# Patient Record
Sex: Female | Born: 1950 | Race: White | Hispanic: No | Marital: Married | Smoking: Never smoker
Health system: Southern US, Community
[De-identification: ages and names within clinical notes are randomized; demographics above are authoritative.]

## PROBLEM LIST (undated history)

## (undated) DIAGNOSIS — C801 Malignant (primary) neoplasm, unspecified: Secondary | ICD-10-CM

## (undated) DIAGNOSIS — E7800 Pure hypercholesterolemia, unspecified: Secondary | ICD-10-CM

## (undated) HISTORY — PX: AUGMENTATION MAMMAPLASTY: SUR837

---

## 2007-04-05 ENCOUNTER — Other Ambulatory Visit: Admission: RE | Admit: 2007-04-05 | Discharge: 2007-04-05 | Payer: Self-pay | Admitting: Family Medicine

## 2007-05-31 ENCOUNTER — Other Ambulatory Visit: Admission: RE | Admit: 2007-05-31 | Discharge: 2007-05-31 | Payer: Self-pay | Admitting: Family Medicine

## 2008-08-06 ENCOUNTER — Other Ambulatory Visit: Admission: RE | Admit: 2008-08-06 | Discharge: 2008-08-06 | Payer: Self-pay | Admitting: Family Medicine

## 2009-10-29 ENCOUNTER — Other Ambulatory Visit: Admission: RE | Admit: 2009-10-29 | Discharge: 2009-10-29 | Payer: Self-pay | Admitting: Family Medicine

## 2010-02-07 ENCOUNTER — Other Ambulatory Visit: Admission: RE | Admit: 2010-02-07 | Discharge: 2010-02-07 | Payer: Self-pay | Admitting: Family Medicine

## 2011-01-27 ENCOUNTER — Other Ambulatory Visit (HOSPITAL_COMMUNITY)
Admission: RE | Admit: 2011-01-27 | Discharge: 2011-01-27 | Disposition: A | Discharge status: Home / Self Care | Payer: BC Managed Care – PPO | Source: Ambulatory Visit | Attending: Family Medicine | Admitting: Family Medicine

## 2011-01-27 ENCOUNTER — Other Ambulatory Visit: Payer: Self-pay | Admitting: Family Medicine

## 2011-01-27 DIAGNOSIS — Z124 Encounter for screening for malignant neoplasm of cervix: Secondary | ICD-10-CM | POA: Insufficient documentation

## 2012-08-09 ENCOUNTER — Other Ambulatory Visit: Payer: Self-pay | Admitting: Family Medicine

## 2012-08-09 DIAGNOSIS — N950 Postmenopausal bleeding: Secondary | ICD-10-CM

## 2012-08-16 ENCOUNTER — Ambulatory Visit
Admission: RE | Admit: 2012-08-16 | Discharge: 2012-08-16 | Disposition: A | Discharge status: Home / Self Care | Payer: BC Managed Care – PPO | Source: Ambulatory Visit | Attending: Family Medicine | Admitting: Family Medicine

## 2012-08-16 ENCOUNTER — Ambulatory Visit: Payer: Self-pay | Admitting: Specialist

## 2012-08-16 DIAGNOSIS — N950 Postmenopausal bleeding: Secondary | ICD-10-CM

## 2012-09-02 ENCOUNTER — Ambulatory Visit: Payer: Self-pay | Admitting: Specialist

## 2012-09-02 LAB — POTASSIUM: Potassium: 4.7 mmol/L (ref 3.5–5.1)

## 2012-09-04 ENCOUNTER — Ambulatory Visit: Payer: Self-pay | Admitting: Specialist

## 2012-10-25 ENCOUNTER — Other Ambulatory Visit (HOSPITAL_COMMUNITY)
Admission: RE | Admit: 2012-10-25 | Discharge: 2012-10-25 | Disposition: A | Discharge status: Home / Self Care | Payer: BC Managed Care – PPO | Source: Ambulatory Visit | Attending: Family Medicine | Admitting: Family Medicine

## 2012-10-25 ENCOUNTER — Other Ambulatory Visit: Payer: Self-pay | Admitting: Family Medicine

## 2012-10-25 DIAGNOSIS — Z124 Encounter for screening for malignant neoplasm of cervix: Secondary | ICD-10-CM | POA: Insufficient documentation

## 2013-01-31 ENCOUNTER — Other Ambulatory Visit (HOSPITAL_COMMUNITY)
Admission: RE | Admit: 2013-01-31 | Discharge: 2013-01-31 | Disposition: A | Discharge status: Home / Self Care | Payer: BC Managed Care – PPO | Source: Ambulatory Visit | Attending: Family Medicine | Admitting: Family Medicine

## 2013-01-31 ENCOUNTER — Other Ambulatory Visit: Payer: Self-pay | Admitting: Family Medicine

## 2013-01-31 DIAGNOSIS — R6889 Other general symptoms and signs: Secondary | ICD-10-CM | POA: Insufficient documentation

## 2013-01-31 DIAGNOSIS — Z1151 Encounter for screening for human papillomavirus (HPV): Secondary | ICD-10-CM | POA: Insufficient documentation

## 2014-09-11 ENCOUNTER — Other Ambulatory Visit: Payer: Self-pay | Admitting: Gastroenterology

## 2015-01-12 NOTE — Op Note (Signed)
PATIENT NAME:  Norma Rodgers, Norma Rodgers MR#:  [id number] DATE OF BIRTH:  1951/02/24  DATE OF PROCEDURE:  09/04/2012  PREOPERATIVE DIAGNOSIS: Tear posterior horn medial meniscus, left knee.  POSTOPERATIVE DIAGNOSIS: Tear posterior horn medial meniscus, left knee.   PROCEDURE: Arthroscopic partial medial meniscectomy, left knee.   SURGEON: Lucas Mallow, MD    ANESTHESIA: General.   COMPLICATIONS: None.   PROCEDURE: After adequate induction of general anesthesia, the left lower leg is placed in the arthroscopy knee holder and secured in the usual fashion. The left knee and leg are thoroughly prepped with alcohol and ChloraPrep and draped in standard sterile fashion. The joint is infiltrated with 10 mL of Marcaine with epinephrine. Diagnostic arthroscopy is performed. There is minimal increased synovitis present in the suprapatellar pouch. The anterior femoral surface is normal. There is very minimal mild chondromalacia on the posterior aspect of the patella. In the medial compartment there is an area of significant chondromalacia of the articular surface of the medial femoral condyle which is at the posteromedial aspect of the knee which directly correlates with a tear in the medial meniscus which is longitudinal. The probe is used to fully define the tear. Using the basket forceps and the full radial resector, the tear is resected back to a stable rim. The ArthroWand is also used. The remainder of the posterior horn is fully probed and no other problems are noted. There is chondromalacia, which is mild, of the tibial articular surface. In the intercondylar notch there is mild increased synovial hypertrophy. The anterior cruciate ligament is normal. The lateral compartment is completely normal. The joint is thoroughly irrigated multiple times. All wounds were closed with 5-0 nylon. The joint is infiltrated with 10 mL of Marcaine with epinephrine with 4 mg of morphine and 1 mL of Depo-Medrol. She tolerated  the procedure quite well. Soft bulky dressing is applied. She is to return to the recovery room in satisfactory condition having tolerated the procedure quite well.  ____________________________ Lucas Mallow, MD ces:drc D: 09/05/2012 11:38:15 ET T: 09/05/2012 11:43:17 ET JOB#: [id number] Lucas Mallow MD ELECTRONICALLY SIGNED 09/12/2012 7:25

## 2015-03-09 ENCOUNTER — Other Ambulatory Visit: Payer: Self-pay | Admitting: Family Medicine

## 2015-03-09 DIAGNOSIS — Z1231 Encounter for screening mammogram for malignant neoplasm of breast: Secondary | ICD-10-CM

## 2015-04-06 ENCOUNTER — Ambulatory Visit
Admission: RE | Admit: 2015-04-06 | Discharge: 2015-04-06 | Disposition: A | Discharge status: Home / Self Care | Payer: No Typology Code available for payment source | Source: Ambulatory Visit | Attending: Family Medicine | Admitting: Family Medicine

## 2015-04-06 DIAGNOSIS — Z1231 Encounter for screening mammogram for malignant neoplasm of breast: Secondary | ICD-10-CM | POA: Insufficient documentation

## 2015-09-06 ENCOUNTER — Encounter: Payer: Self-pay | Admitting: Emergency Medicine

## 2015-09-06 ENCOUNTER — Ambulatory Visit
Admission: EM | Admit: 2015-09-06 | Discharge: 2015-09-06 | Disposition: A | Discharge status: Home / Self Care | Payer: No Typology Code available for payment source | Attending: Family Medicine | Admitting: Family Medicine

## 2015-09-06 DIAGNOSIS — J0100 Acute maxillary sinusitis, unspecified: Secondary | ICD-10-CM

## 2015-09-06 DIAGNOSIS — J0110 Acute frontal sinusitis, unspecified: Secondary | ICD-10-CM

## 2015-09-06 HISTORY — DX: Pure hypercholesterolemia, unspecified: E78.00

## 2015-09-06 MED ORDER — BENZONATATE 100 MG PO CAPS: 100.0000 mg | ORAL_CAPSULE | Freq: Three times a day (TID) | ORAL | Status: AC | PRN

## 2015-09-06 MED ORDER — FLUCONAZOLE 150 MG PO TABS: 150.0000 mg | ORAL_TABLET | Freq: Every day | ORAL | Status: AC

## 2015-09-06 MED ORDER — AMOXICILLIN-POT CLAVULANATE 875-125 MG PO TABS: 1.0000 | ORAL_TABLET | Freq: Two times a day (BID) | ORAL | Status: AC

## 2015-09-06 NOTE — ED Provider Notes (Signed)
[facility]  ____________________________________________  Time seen: Approximately 12:46 PM  I have reviewed the triage vital signs and the nursing notes.   HISTORY  Chief Complaint Facial Pain and Nasal Congestion  HPI Norma Rodgers is a 64 y.o. female  presents for one week of runny nose, nasal congestion, sinus pressure for one week. States started with clear rhinorrhea but has quickly progressed and states frequent green thick nasal drainage. Patient states intermittent cough which is more at night as well as first thing in the morning with feeling the drainage in the back of her throat.  Reports continues to eat and drink well. Denies chest pain, shortness of breath, abdominal pain, dizziness, weakness.  States current sinus pain is in the front of her face and cheeks described as a pressure at 6 out of 10. Denies pain radiation. Denies aggravating factors. States unrelieved with over-the-counter medications.  PCP: Leonides Schanz   Past Medical History  Diagnosis Date  . Hypercholesteremia     There are no active problems to display for this patient.   Past Surgical History  Procedure Laterality Date  . Augmentation mammaplasty Bilateral 20+ yrs ago    Current Outpatient Rx  Name  Route  Sig  Dispense  Refill  . calcium-vitamin D (OSCAL WITH D) 500-200 MG-UNIT tablet   Oral   Take 1 tablet by mouth daily.         Marland Kitchen omega-3 acid ethyl esters (LOVAZA) 1 G capsule   Oral   Take 1 g by mouth daily.         . pravastatin (PRAVACHOL) 20 MG tablet   Oral   Take 20 mg by mouth daily.           Allergies Review of patient's allergies indicates no known allergies.  History reviewed. No pertinent family history.  Social History Social History  Substance Use Topics  . Smoking status: Never Smoker   . Smokeless tobacco: None  . Alcohol Use: Yes   LMP: post menopausal  Review of Systems Constitutional: Felt she may have had a low grade fever  yesterday. Did not measure. Felt warm. None since.  Eyes: No visual changes. ENT: No sore throat. Positive for runny nose, nasal congestion and sinus pressure.  Cardiovascular: Denies chest pain. Respiratory: Denies shortness of breath. Gastrointestinal: No abdominal pain.  No nausea, no vomiting.  No diarrhea.  No constipation. Genitourinary: Negative for dysuria. Musculoskeletal: Negative for back pain. Skin: Negative for rash. Neurological: Negative for headaches, focal weakness or numbness.  10-point ROS otherwise negative.  ____________________________________________   PHYSICAL EXAM:  VITAL SIGNS: ED Triage Vitals  Enc Vitals Group     BP 09/06/15 1232 167/81 mmHg     Pulse Rate 09/06/15 1232 97     Resp 09/06/15 1232 16     Temp 09/06/15 1232 96.8 F (36 C)     Temp Source 09/06/15 1232 Tympanic     SpO2 09/06/15 1232 100 %     Weight 09/06/15 1232 175 lb (79.379 kg)     Height 09/06/15 1232 5\' 6"  (1.676 m)     Head Cir --      Peak Flow --      Pain Score 09/06/15 1236 3     Pain Loc --      Pain Edu? --      Excl. in [place]? --     Constitutional: Alert and oriented. Well appearing and in no acute distress. Eyes: Conjunctivae are normal. PERRL.  EOMI. Head: Atraumatic. Moderate tenderness to palpation bilateral frontal and maxillary sinuses. No swelling. No erythema.  Ears: no erythema, normal TMs bilaterally.   Nose: nasal congestion, nasal turbinate erythema.   Mouth/Throat: Mucous membranes are moist.  Oropharynx non-erythematous. No tonsillar swelling or exudate. Neck: No stridor.  No cervical spine tenderness to palpation. Hematological/Lymphatic/Immunilogical: No cervical lymphadenopathy. Cardiovascular: Normal rate, regular rhythm. Grossly normal heart sounds.  Good peripheral circulation. Respiratory: Normal respiratory effort.  No retractions. Lungs CTAB. No wheezes, rales or rhonchi. Good air movement. Gastrointestinal: Soft and nontender. No distention.  Normal Bowel sounds.  Musculoskeletal: No lower or upper extremity tenderness nor edema. Bilateral pedal pulses equal and easily palpated.  Neurologic:  Normal speech and language. No gross focal neurologic deficits are appreciated. No gait instability. Skin:  Skin is warm, dry and intact. No rash noted. Psychiatric: Mood and affect are normal. Speech and behavior are normal.  ____________________________________________   LABS (all labs ordered are listed, but only abnormal results are displayed)  Labs Reviewed - No data to display ____________________________________________   INITIAL IMPRESSION / ASSESSMENT AND PLAN / ED COURSE  Pertinent labs & imaging results that were available during my care of the patient were reviewed by me and considered in my medical decision making (see chart for details).   Very well-appearing patient. No acute distress. Presents for the complaints of over a week of runny nose, nasal congestion, sinus pressure and purulent green nasal drainage. Lungs clear throughout. Moist mucous membranes. Abdomen soft and nontender. Will treat frontal and maxillary sinusitis with oral Augmentin, when necessary Tessalon Perles, supportive treatments including rest, fluids and PCP follow-up. Patient requests prescription for Diflucan as she reports vaginal yeast infections with antibiotics, Rx given.    Counseled patient regarding monitoring blood pressure at home and keeping journal. Patient reports that she just recently saw her primary care physician and  blood pressure was normal "120s/70s". Patient states that her blood pressure tends to elevate when she takes over-the-counter medications as well as when she does not feel well, encourage patients to monitor over-the-counter medication use and to decrease. Denies chest pain, shortness of breath, vision changes, dizziness or weakness.  Discussed follow up with Primary care physician this week. Discussed follow up and return  parameters including no resolution or any worsening concerns. Patient verbalized understanding and agreed to plan.   ____________________________________________   FINAL CLINICAL IMPRESSION(S) / ED DIAGNOSES  Final diagnoses:  Acute frontal sinusitis, recurrence not specified  Acute maxillary sinusitis, recurrence not specified       Marylene Land, NP 09/06/15 1431

## 2015-09-06 NOTE — ED Notes (Signed)
Patient c/o sinus pressure and pain, HAs, and nasal congestion for a week.

## 2015-09-06 NOTE — Discharge Instructions (Signed)
Rest. Drink and eat regularly. Take medication as prescribed.   Follow up with your primary care physician this week as needed. Return to Urgent care as needed for new or worsening concerns.   Sinusitis, Adult Sinusitis is redness, soreness, and inflammation of the paranasal sinuses. Paranasal sinuses are air pockets within the bones of your face. They are located beneath your eyes, in the middle of your forehead, and above your eyes. In healthy paranasal sinuses, mucus is able to drain out, and air is able to circulate through them by way of your nose. However, when your paranasal sinuses are inflamed, mucus and air can become trapped. This can allow bacteria and other germs to grow and cause infection. Sinusitis can develop quickly and last only a short time (acute) or continue over a long period (chronic). Sinusitis that lasts for more than 12 weeks is considered chronic. CAUSES Causes of sinusitis include:  Allergies.  Structural abnormalities, such as displacement of the cartilage that separates your nostrils (deviated septum), which can decrease the air flow through your nose and sinuses and affect sinus drainage.  Functional abnormalities, such as when the small hairs (cilia) that line your sinuses and help remove mucus do not work properly or are not present. SIGNS AND SYMPTOMS Symptoms of acute and chronic sinusitis are the same. The primary symptoms are pain and pressure around the affected sinuses. Other symptoms include:  Upper toothache.  Earache.  Headache.  Bad breath.  Decreased sense of smell and taste.  A cough, which worsens when you are lying flat.  Fatigue.  Fever.  Thick drainage from your nose, which often is green and may contain pus (purulent).  Swelling and warmth over the affected sinuses. DIAGNOSIS Your health care provider will perform a physical exam. During your exam, your health care provider may perform any of the following to help determine if  you have acute sinusitis or chronic sinusitis:  Look in your nose for signs of abnormal growths in your nostrils (nasal polyps).  Tap over the affected sinus to check for signs of infection.  View the inside of your sinuses using an imaging device that has a light attached (endoscope). If your health care provider suspects that you have chronic sinusitis, one or more of the following tests may be recommended:  Allergy tests.  Nasal culture. A sample of mucus is taken from your nose, sent to a lab, and screened for bacteria.  Nasal cytology. A sample of mucus is taken from your nose and examined by your health care provider to determine if your sinusitis is related to an allergy. TREATMENT Most cases of acute sinusitis are related to a viral infection and will resolve on their own within 10 days. Sometimes, medicines are prescribed to help relieve symptoms of both acute and chronic sinusitis. These may include pain medicines, decongestants, nasal steroid sprays, or saline sprays. However, for sinusitis related to a bacterial infection, your health care provider will prescribe antibiotic medicines. These are medicines that will help kill the bacteria causing the infection. Rarely, sinusitis is caused by a fungal infection. In these cases, your health care provider will prescribe antifungal medicine. For some cases of chronic sinusitis, surgery is needed. Generally, these are cases in which sinusitis recurs more than 3 times per year, despite other treatments. HOME CARE INSTRUCTIONS  Drink plenty of water. Water helps thin the mucus so your sinuses can drain more easily.  Use a humidifier.  Inhale steam 3-4 times a day (for example, sit  in the bathroom with the shower running). °· Apply a warm, moist washcloth to your face 3-4 times a day, or as directed by your health care provider. °· Use saline nasal sprays to help moisten and clean your sinuses. °· Take medicines only as directed by your  health care provider. °· If you were prescribed either an antibiotic or antifungal medicine, finish it all even if you start to feel better. °SEEK IMMEDIATE MEDICAL CARE IF: °· You have increasing pain or severe headaches. °· You have nausea, vomiting, or drowsiness. °· You have swelling around your face. °· You have vision problems. °· You have a stiff neck. °· You have difficulty breathing. °  °This information is not intended to replace advice given to you by your health care provider. Make sure you discuss any questions you have with your health care provider. °  °Document Released: 09/11/2005 Document Revised: 10/02/2014 Document Reviewed: 09/26/2011 °Elsevier Interactive Patient Education ©2016 [employer] ° °

## 2015-09-27 ENCOUNTER — Ambulatory Visit
Admission: EM | Admit: 2015-09-27 | Discharge: 2015-09-27 | Disposition: A | Discharge status: Home / Self Care | Payer: PRIVATE HEALTH INSURANCE | Attending: Family Medicine | Admitting: Family Medicine

## 2015-09-27 ENCOUNTER — Encounter: Payer: Self-pay | Admitting: Emergency Medicine

## 2015-09-27 DIAGNOSIS — J320 Chronic maxillary sinusitis: Secondary | ICD-10-CM | POA: Diagnosis not present

## 2015-09-27 MED ORDER — HYDROCOD POLST-CPM POLST ER 10-8 MG/5ML PO SUER: 5.0000 mL | Freq: Two times a day (BID) | ORAL | Status: AC

## 2015-09-27 MED ORDER — FLUTICASONE PROPIONATE 50 MCG/ACT NA SUSP: 2.0000 | Freq: Every day | NASAL | Status: AC

## 2015-09-27 MED ORDER — AZITHROMYCIN 250 MG PO TABS
ORAL_TABLET | ORAL | Status: AC
Start: 2015-09-27 — End: ?

## 2015-09-27 NOTE — Discharge Instructions (Signed)

## 2015-09-27 NOTE — ED Provider Notes (Signed)
CSN: [id number]     Arrival date & time 09/27/15  1539 History   First MD Initiated Contact with Patient 09/27/15 1843     Chief Complaint  Patient presents with  . Facial Pain  . Nasal Congestion   (Consider location/radiation/quality/duration/timing/severity/associated sxs/prior Treatment) HPI   65 year old female who returns today after being seen here on 09/06/2015 for sinus infection. She states that she was treated with a Augmentin and Tessalon Perles which all helped and she got better and has since returned with the same symptoms. She states that she has had fever although she has never measured it but has been sweating and feels that that represents the fever. She putting of facial tenderness over the maxillary sinus and states that in the past she's had chronic sinusitis on the left that was discovered during a CAT scan performed for a neurological problem that she was having at the time. Over-the-counter medications have not been providing any relief for her. Temperature today is 97 pulse of 87 respirations 16 blood pressure 162/68 and an O2 sat on room air. 100%  Past Medical History  Diagnosis Date  . Hypercholesteremia    Past Surgical History  Procedure Laterality Date  . Augmentation mammaplasty Bilateral 20+ yrs ago   History reviewed. No pertinent family history. Social History  Substance Use Topics  . Smoking status: Never Smoker   . Smokeless tobacco: None  . Alcohol Use: Yes   OB History    No data available     Review of Systems  Constitutional: Positive for chills. Negative for fever and fatigue.  HENT: Positive for congestion, postnasal drip, rhinorrhea, sinus pressure, sneezing and voice change.   Respiratory: Positive for cough. Negative for shortness of breath, wheezing and stridor.   All other systems reviewed and are negative.   Allergies  Review of patient's allergies indicates no known allergies.  Home Medications   Prior to Admission  medications   Medication Sig Start Date End Date Taking? Authorizing Provider  amoxicillin-clavulanate (AUGMENTIN) 875-125 MG tablet Take 1 tablet by mouth every 12 (twelve) hours. 09/06/15   Marylene Land, NP  azithromycin (ZITHROMAX Z-PAK) 250 MG tablet Use as per package instructions 09/27/15   Lorin Picket, PA-C  benzonatate (TESSALON PERLES) 100 MG capsule Take 1 capsule (100 mg total) by mouth 3 (three) times daily as needed for cough. 09/06/15   Marylene Land, NP  calcium-vitamin D (OSCAL WITH D) 500-200 MG-UNIT tablet Take 1 tablet by mouth daily.    Historical Provider, MD  chlorpheniramine-HYDROcodone (TUSSIONEX PENNKINETIC ER) 10-8 MG/5ML SUER Take 5 mLs by mouth 2 (two) times daily. 09/27/15   Lorin Picket, PA-C  fluconazole (DIFLUCAN) 150 MG tablet Take 1 tablet (150 mg total) by mouth daily. 09/06/15   Marylene Land, NP  fluticasone (FLONASE) 50 MCG/ACT nasal spray Place 2 sprays into both nostrils daily. 09/27/15   Lorin Picket, PA-C  omega-3 acid ethyl esters (LOVAZA) 1 G capsule Take 1 g by mouth daily.    Historical Provider, MD  pravastatin (PRAVACHOL) 20 MG tablet Take 20 mg by mouth daily.    Historical Provider, MD   Meds Ordered and Administered this Visit  Medications - No data to display  BP 162/68 mmHg  Pulse 87  Temp(Src) 97 F (36.1 C) (Tympanic)  Resp 16  Ht 5\' 6"  (1.676 m)  Wt 170 lb (77.111 kg)  BMI 27.45 kg/m2  SpO2 100% No data found.   Physical Exam  Constitutional: She is  oriented to person, place, and time. She appears well-developed and well-nourished. No distress.  HENT:  Head: Normocephalic and atraumatic.  Right Ear: External ear normal.  Left Ear: External ear normal.  Nose: Nose normal.  Mouth/Throat: Oropharynx is clear and moist. No oropharyngeal exudate.  Eyes: Conjunctivae are normal. Pupils are equal, round, and reactive to light.  Neck: Normal range of motion. Neck supple.  Pulmonary/Chest: Breath sounds normal. No  respiratory distress. She has no wheezes. She has no rales.  Musculoskeletal: Normal range of motion. She exhibits no edema or tenderness.  Lymphadenopathy:    She has no cervical adenopathy.  Neurological: She is alert and oriented to person, place, and time.  Skin: Skin is warm and dry. She is not diaphoretic.  Psychiatric: She has a normal mood and affect. Her behavior is normal. Judgment and thought content normal.  Nursing note and vitals reviewed.   ED Course  Procedures (including critical care time)  Labs Review Labs Reviewed - No data to display  Imaging Review No results found.   Visual Acuity Review  Right Eye Distance:   Left Eye Distance:   Bilateral Distance:    Right Eye Near:   Left Eye Near:    Bilateral Near:         MDM   1. Chronic left maxillary sinusitis    Discharge Medication List as of 09/27/2015  7:05 PM    START taking these medications   Details  azithromycin (ZITHROMAX Z-PAK) 250 MG tablet Use as per package instructions, Print    chlorpheniramine-HYDROcodone (TUSSIONEX PENNKINETIC ER) 10-8 MG/5ML SUER Take 5 mLs by mouth 2 (two) times daily., Starting 09/27/2015, Until Discontinued, Print    fluticasone (FLONASE) 50 MCG/ACT nasal spray Place 2 sprays into both nostrils daily., Starting 09/27/2015, Until Discontinued, Print      Plan: 1. Test/x-ray results and diagnosis reviewed with patient 2. rx as per orders; risks, benefits, potential side effects reviewed with patient 3. Recommend supportive treatment with fluids and rest. Follow-up with her primary care physician if she continues to have problems 4. F/u prn if symptoms worsen or don't improve     Lorin Picket, PA-C 09/27/15 2051

## 2015-09-27 NOTE — ED Notes (Addendum)
Patient c/o sinus infection and took Augmentin on Dec. 12.  Patient states that she has not completely gotten better.

## 2016-04-17 ENCOUNTER — Other Ambulatory Visit: Payer: Self-pay | Admitting: Family Medicine

## 2016-04-17 DIAGNOSIS — Z1231 Encounter for screening mammogram for malignant neoplasm of breast: Secondary | ICD-10-CM

## 2016-05-17 ENCOUNTER — Other Ambulatory Visit: Payer: Self-pay | Admitting: Family Medicine

## 2016-05-17 ENCOUNTER — Ambulatory Visit
Admission: RE | Admit: 2016-05-17 | Discharge: 2016-05-17 | Disposition: A | Discharge status: Home / Self Care | Payer: PRIVATE HEALTH INSURANCE | Source: Ambulatory Visit | Attending: Family Medicine | Admitting: Family Medicine

## 2016-05-17 DIAGNOSIS — Z1231 Encounter for screening mammogram for malignant neoplasm of breast: Secondary | ICD-10-CM | POA: Insufficient documentation

## 2017-02-26 ENCOUNTER — Other Ambulatory Visit: Payer: Self-pay | Admitting: Family Medicine

## 2017-02-26 ENCOUNTER — Other Ambulatory Visit (HOSPITAL_COMMUNITY)
Admission: RE | Admit: 2017-02-26 | Discharge: 2017-02-26 | Disposition: A | Discharge status: Home / Self Care | Payer: PRIVATE HEALTH INSURANCE | Source: Ambulatory Visit | Attending: Family Medicine | Admitting: Family Medicine

## 2017-02-26 DIAGNOSIS — Z124 Encounter for screening for malignant neoplasm of cervix: Secondary | ICD-10-CM | POA: Insufficient documentation

## 2017-03-02 LAB — CYTOLOGY - PAP
DIAGNOSIS: UNDETERMINED — AB
HPV (WINDOPATH): NOT DETECTED

## 2017-04-06 ENCOUNTER — Other Ambulatory Visit: Payer: Self-pay | Admitting: Family Medicine

## 2017-04-06 DIAGNOSIS — Z1231 Encounter for screening mammogram for malignant neoplasm of breast: Secondary | ICD-10-CM

## 2017-05-31 ENCOUNTER — Ambulatory Visit
Admission: RE | Admit: 2017-05-31 | Discharge: 2017-05-31 | Disposition: A | Discharge status: Home / Self Care | Payer: PRIVATE HEALTH INSURANCE | Source: Ambulatory Visit | Attending: Family Medicine | Admitting: Family Medicine

## 2017-05-31 DIAGNOSIS — Z1231 Encounter for screening mammogram for malignant neoplasm of breast: Secondary | ICD-10-CM

## 2017-05-31 HISTORY — DX: Malignant (primary) neoplasm, unspecified: C80.1

## 2017-08-23 DIAGNOSIS — J0100 Acute maxillary sinusitis, unspecified: Secondary | ICD-10-CM | POA: Diagnosis not present

## 2017-08-23 DIAGNOSIS — R05 Cough: Secondary | ICD-10-CM | POA: Diagnosis not present

## 2017-09-27 DIAGNOSIS — Z23 Encounter for immunization: Secondary | ICD-10-CM | POA: Diagnosis not present

## 2017-12-05 DIAGNOSIS — Z23 Encounter for immunization: Secondary | ICD-10-CM | POA: Diagnosis not present

## 2018-04-02 DIAGNOSIS — N183 Chronic kidney disease, stage 3 (moderate): Secondary | ICD-10-CM | POA: Diagnosis not present

## 2018-04-02 DIAGNOSIS — N952 Postmenopausal atrophic vaginitis: Secondary | ICD-10-CM | POA: Diagnosis not present

## 2018-04-02 DIAGNOSIS — S61411A Laceration without foreign body of right hand, initial encounter: Secondary | ICD-10-CM | POA: Diagnosis not present

## 2018-04-02 DIAGNOSIS — E782 Mixed hyperlipidemia: Secondary | ICD-10-CM | POA: Diagnosis not present

## 2018-04-23 ENCOUNTER — Other Ambulatory Visit: Payer: Self-pay | Admitting: Family Medicine

## 2018-04-23 DIAGNOSIS — Z1231 Encounter for screening mammogram for malignant neoplasm of breast: Secondary | ICD-10-CM

## 2018-06-24 ENCOUNTER — Ambulatory Visit
Admission: RE | Admit: 2018-06-24 | Discharge: 2018-06-24 | Disposition: A | Discharge status: Home / Self Care | Payer: Medicare Other | Source: Ambulatory Visit | Attending: Family Medicine | Admitting: Family Medicine

## 2018-06-24 ENCOUNTER — Other Ambulatory Visit: Payer: Self-pay | Admitting: Family Medicine

## 2018-06-24 DIAGNOSIS — Z1231 Encounter for screening mammogram for malignant neoplasm of breast: Secondary | ICD-10-CM | POA: Insufficient documentation

## 2018-07-11 DIAGNOSIS — Z23 Encounter for immunization: Secondary | ICD-10-CM | POA: Diagnosis not present

## 2018-07-11 DIAGNOSIS — E782 Mixed hyperlipidemia: Secondary | ICD-10-CM | POA: Diagnosis not present

## 2018-07-11 DIAGNOSIS — Z0000 Encounter for general adult medical examination without abnormal findings: Secondary | ICD-10-CM | POA: Diagnosis not present

## 2018-07-11 DIAGNOSIS — Z01419 Encounter for gynecological examination (general) (routine) without abnormal findings: Secondary | ICD-10-CM | POA: Diagnosis not present

## 2018-07-19 DIAGNOSIS — Z08 Encounter for follow-up examination after completed treatment for malignant neoplasm: Secondary | ICD-10-CM | POA: Diagnosis not present

## 2018-07-19 DIAGNOSIS — L570 Actinic keratosis: Secondary | ICD-10-CM | POA: Diagnosis not present

## 2018-07-19 DIAGNOSIS — X32XXXA Exposure to sunlight, initial encounter: Secondary | ICD-10-CM | POA: Diagnosis not present

## 2018-07-19 DIAGNOSIS — Z85828 Personal history of other malignant neoplasm of skin: Secondary | ICD-10-CM | POA: Diagnosis not present

## 2018-07-19 DIAGNOSIS — L821 Other seborrheic keratosis: Secondary | ICD-10-CM | POA: Diagnosis not present

## 2018-09-24 DIAGNOSIS — J069 Acute upper respiratory infection, unspecified: Secondary | ICD-10-CM | POA: Diagnosis not present

## 2019-03-13 DIAGNOSIS — E782 Mixed hyperlipidemia: Secondary | ICD-10-CM | POA: Diagnosis not present

## 2019-05-21 ENCOUNTER — Other Ambulatory Visit: Payer: Self-pay | Admitting: Family Medicine

## 2019-05-21 DIAGNOSIS — Z1231 Encounter for screening mammogram for malignant neoplasm of breast: Secondary | ICD-10-CM

## 2019-06-04 DIAGNOSIS — E782 Mixed hyperlipidemia: Secondary | ICD-10-CM | POA: Diagnosis not present

## 2019-06-27 ENCOUNTER — Other Ambulatory Visit: Payer: Self-pay | Admitting: Family Medicine

## 2019-06-27 ENCOUNTER — Ambulatory Visit
Admission: RE | Admit: 2019-06-27 | Discharge: 2019-06-27 | Disposition: A | Discharge status: Home / Self Care | Payer: Medicare Other | Source: Ambulatory Visit | Attending: Family Medicine | Admitting: Family Medicine

## 2019-06-27 DIAGNOSIS — Z1231 Encounter for screening mammogram for malignant neoplasm of breast: Secondary | ICD-10-CM

## 2019-07-17 DIAGNOSIS — Z0000 Encounter for general adult medical examination without abnormal findings: Secondary | ICD-10-CM | POA: Diagnosis not present

## 2019-07-17 DIAGNOSIS — R634 Abnormal weight loss: Secondary | ICD-10-CM | POA: Diagnosis not present

## 2019-07-17 DIAGNOSIS — N1831 Chronic kidney disease, stage 3a: Secondary | ICD-10-CM | POA: Diagnosis not present

## 2019-07-17 DIAGNOSIS — Z1389 Encounter for screening for other disorder: Secondary | ICD-10-CM | POA: Diagnosis not present

## 2019-07-17 DIAGNOSIS — N952 Postmenopausal atrophic vaginitis: Secondary | ICD-10-CM | POA: Diagnosis not present

## 2019-07-17 DIAGNOSIS — Z85828 Personal history of other malignant neoplasm of skin: Secondary | ICD-10-CM | POA: Diagnosis not present

## 2019-07-17 DIAGNOSIS — Z23 Encounter for immunization: Secondary | ICD-10-CM | POA: Diagnosis not present

## 2019-07-17 DIAGNOSIS — E782 Mixed hyperlipidemia: Secondary | ICD-10-CM | POA: Diagnosis not present

## 2019-07-17 DIAGNOSIS — Z86010 Personal history of colonic polyps: Secondary | ICD-10-CM | POA: Diagnosis not present

## 2019-08-06 DIAGNOSIS — L720 Epidermal cyst: Secondary | ICD-10-CM | POA: Diagnosis not present

## 2019-08-06 DIAGNOSIS — L570 Actinic keratosis: Secondary | ICD-10-CM | POA: Diagnosis not present

## 2019-08-06 DIAGNOSIS — Z85828 Personal history of other malignant neoplasm of skin: Secondary | ICD-10-CM | POA: Diagnosis not present

## 2019-08-06 DIAGNOSIS — X32XXXA Exposure to sunlight, initial encounter: Secondary | ICD-10-CM | POA: Diagnosis not present

## 2019-08-06 DIAGNOSIS — L821 Other seborrheic keratosis: Secondary | ICD-10-CM | POA: Diagnosis not present

## 2019-08-06 DIAGNOSIS — Z08 Encounter for follow-up examination after completed treatment for malignant neoplasm: Secondary | ICD-10-CM | POA: Diagnosis not present

## 2020-04-29 ENCOUNTER — Other Ambulatory Visit: Payer: Self-pay | Admitting: Family Medicine

## 2020-04-29 DIAGNOSIS — Z1231 Encounter for screening mammogram for malignant neoplasm of breast: Secondary | ICD-10-CM

## 2020-05-21 ENCOUNTER — Other Ambulatory Visit: Payer: Medicare Other

## 2020-05-21 ENCOUNTER — Other Ambulatory Visit: Payer: Self-pay | Admitting: Critical Care Medicine

## 2020-05-21 DIAGNOSIS — Z20822 Contact with and (suspected) exposure to covid-19: Secondary | ICD-10-CM

## 2020-05-22 LAB — NOVEL CORONAVIRUS, NAA: SARS-CoV-2, NAA: NOT DETECTED

## 2020-05-22 LAB — SPECIMEN STATUS REPORT

## 2020-05-22 LAB — SARS-COV-2, NAA 2 DAY TAT

## 2020-05-24 ENCOUNTER — Other Ambulatory Visit: Payer: Self-pay

## 2020-06-30 ENCOUNTER — Other Ambulatory Visit: Payer: Self-pay

## 2020-06-30 ENCOUNTER — Ambulatory Visit
Admission: RE | Admit: 2020-06-30 | Discharge: 2020-06-30 | Disposition: A | Discharge status: Home / Self Care | Payer: Medicare Other | Source: Ambulatory Visit | Attending: Family Medicine | Admitting: Family Medicine

## 2020-06-30 DIAGNOSIS — Z1231 Encounter for screening mammogram for malignant neoplasm of breast: Secondary | ICD-10-CM | POA: Diagnosis not present

## 2020-07-13 DIAGNOSIS — E782 Mixed hyperlipidemia: Secondary | ICD-10-CM | POA: Diagnosis not present

## 2020-07-27 DIAGNOSIS — E875 Hyperkalemia: Secondary | ICD-10-CM | POA: Diagnosis not present

## 2020-07-29 DIAGNOSIS — Z1389 Encounter for screening for other disorder: Secondary | ICD-10-CM | POA: Diagnosis not present

## 2020-07-29 DIAGNOSIS — Z01419 Encounter for gynecological examination (general) (routine) without abnormal findings: Secondary | ICD-10-CM | POA: Diagnosis not present

## 2020-07-29 DIAGNOSIS — Z0000 Encounter for general adult medical examination without abnormal findings: Secondary | ICD-10-CM | POA: Diagnosis not present

## 2020-07-29 DIAGNOSIS — Z23 Encounter for immunization: Secondary | ICD-10-CM | POA: Diagnosis not present

## 2020-07-29 DIAGNOSIS — E782 Mixed hyperlipidemia: Secondary | ICD-10-CM | POA: Diagnosis not present

## 2020-07-29 DIAGNOSIS — I10 Essential (primary) hypertension: Secondary | ICD-10-CM | POA: Diagnosis not present

## 2020-07-29 DIAGNOSIS — N952 Postmenopausal atrophic vaginitis: Secondary | ICD-10-CM | POA: Diagnosis not present

## 2020-07-29 DIAGNOSIS — Z86010 Personal history of colonic polyps: Secondary | ICD-10-CM | POA: Diagnosis not present

## 2020-07-29 DIAGNOSIS — N1831 Chronic kidney disease, stage 3a: Secondary | ICD-10-CM | POA: Diagnosis not present

## 2020-07-29 DIAGNOSIS — Z85828 Personal history of other malignant neoplasm of skin: Secondary | ICD-10-CM | POA: Diagnosis not present

## 2020-08-05 DIAGNOSIS — Z85828 Personal history of other malignant neoplasm of skin: Secondary | ICD-10-CM | POA: Diagnosis not present

## 2020-08-05 DIAGNOSIS — L821 Other seborrheic keratosis: Secondary | ICD-10-CM | POA: Diagnosis not present

## 2020-08-05 DIAGNOSIS — L820 Inflamed seborrheic keratosis: Secondary | ICD-10-CM | POA: Diagnosis not present

## 2020-08-05 DIAGNOSIS — L538 Other specified erythematous conditions: Secondary | ICD-10-CM | POA: Diagnosis not present

## 2020-08-05 DIAGNOSIS — D2262 Melanocytic nevi of left upper limb, including shoulder: Secondary | ICD-10-CM | POA: Diagnosis not present

## 2020-08-05 DIAGNOSIS — D225 Melanocytic nevi of trunk: Secondary | ICD-10-CM | POA: Diagnosis not present

## 2020-08-05 DIAGNOSIS — R58 Hemorrhage, not elsewhere classified: Secondary | ICD-10-CM | POA: Diagnosis not present

## 2020-08-05 DIAGNOSIS — H61032 Chondritis of left external ear: Secondary | ICD-10-CM | POA: Diagnosis not present

## 2020-08-05 DIAGNOSIS — D2261 Melanocytic nevi of right upper limb, including shoulder: Secondary | ICD-10-CM | POA: Diagnosis not present

## 2020-09-27 ENCOUNTER — Other Ambulatory Visit: Payer: Medicare Other

## 2020-09-27 DIAGNOSIS — Z20822 Contact with and (suspected) exposure to covid-19: Secondary | ICD-10-CM | POA: Diagnosis not present

## 2020-09-29 LAB — SARS-COV-2, NAA 2 DAY TAT

## 2020-09-29 LAB — NOVEL CORONAVIRUS, NAA: SARS-CoV-2, NAA: DETECTED — AB

## 2020-10-04 ENCOUNTER — Telehealth: Payer: Self-pay | Admitting: *Deleted

## 2020-10-04 NOTE — Telephone Encounter (Signed)
Information only. Please disregard release of information statement. Patient tested positive 12/29 and has not had any symptoms since 09/24/20. She is asking if she is clear to stop isolation.

## 2020-10-19 DIAGNOSIS — M1711 Unilateral primary osteoarthritis, right knee: Secondary | ICD-10-CM | POA: Diagnosis not present

## 2021-01-04 DIAGNOSIS — Z86010 Personal history of colonic polyps: Secondary | ICD-10-CM | POA: Diagnosis not present

## 2021-01-04 DIAGNOSIS — K5730 Diverticulosis of large intestine without perforation or abscess without bleeding: Secondary | ICD-10-CM | POA: Diagnosis not present

## 2021-02-14 DIAGNOSIS — E782 Mixed hyperlipidemia: Secondary | ICD-10-CM | POA: Diagnosis not present

## 2021-02-14 DIAGNOSIS — I10 Essential (primary) hypertension: Secondary | ICD-10-CM | POA: Diagnosis not present

## 2021-02-14 DIAGNOSIS — Z23 Encounter for immunization: Secondary | ICD-10-CM | POA: Diagnosis not present

## 2021-05-31 ENCOUNTER — Other Ambulatory Visit: Payer: Self-pay | Admitting: Family Medicine

## 2021-05-31 DIAGNOSIS — Z1231 Encounter for screening mammogram for malignant neoplasm of breast: Secondary | ICD-10-CM

## 2021-07-12 ENCOUNTER — Ambulatory Visit
Admission: RE | Admit: 2021-07-12 | Discharge: 2021-07-12 | Disposition: A | Discharge status: Home / Self Care | Payer: Medicare Other | Source: Ambulatory Visit | Attending: Family Medicine | Admitting: Family Medicine

## 2021-07-12 ENCOUNTER — Other Ambulatory Visit: Payer: Self-pay

## 2021-07-12 DIAGNOSIS — Z1231 Encounter for screening mammogram for malignant neoplasm of breast: Secondary | ICD-10-CM | POA: Diagnosis not present

## 2021-07-28 DIAGNOSIS — N1831 Chronic kidney disease, stage 3a: Secondary | ICD-10-CM | POA: Diagnosis not present

## 2021-07-28 DIAGNOSIS — I10 Essential (primary) hypertension: Secondary | ICD-10-CM | POA: Diagnosis not present

## 2021-07-28 DIAGNOSIS — E782 Mixed hyperlipidemia: Secondary | ICD-10-CM | POA: Diagnosis not present

## 2021-08-04 ENCOUNTER — Other Ambulatory Visit: Payer: Self-pay | Admitting: Family Medicine

## 2021-08-04 DIAGNOSIS — Z1389 Encounter for screening for other disorder: Secondary | ICD-10-CM | POA: Diagnosis not present

## 2021-08-04 DIAGNOSIS — Z79899 Other long term (current) drug therapy: Secondary | ICD-10-CM | POA: Diagnosis not present

## 2021-08-04 DIAGNOSIS — I10 Essential (primary) hypertension: Secondary | ICD-10-CM | POA: Diagnosis not present

## 2021-08-04 DIAGNOSIS — N952 Postmenopausal atrophic vaginitis: Secondary | ICD-10-CM | POA: Diagnosis not present

## 2021-08-04 DIAGNOSIS — E2839 Other primary ovarian failure: Secondary | ICD-10-CM

## 2021-08-04 DIAGNOSIS — L298 Other pruritus: Secondary | ICD-10-CM | POA: Diagnosis not present

## 2021-08-04 DIAGNOSIS — N1831 Chronic kidney disease, stage 3a: Secondary | ICD-10-CM | POA: Diagnosis not present

## 2021-08-04 DIAGNOSIS — Z23 Encounter for immunization: Secondary | ICD-10-CM | POA: Diagnosis not present

## 2021-08-04 DIAGNOSIS — Z0000 Encounter for general adult medical examination without abnormal findings: Secondary | ICD-10-CM | POA: Diagnosis not present

## 2021-08-04 DIAGNOSIS — M25561 Pain in right knee: Secondary | ICD-10-CM | POA: Diagnosis not present

## 2021-08-04 DIAGNOSIS — Z85828 Personal history of other malignant neoplasm of skin: Secondary | ICD-10-CM | POA: Diagnosis not present

## 2021-08-04 DIAGNOSIS — E782 Mixed hyperlipidemia: Secondary | ICD-10-CM | POA: Diagnosis not present

## 2021-08-04 DIAGNOSIS — Z86010 Personal history of colonic polyps: Secondary | ICD-10-CM | POA: Diagnosis not present

## 2021-08-10 DIAGNOSIS — D485 Neoplasm of uncertain behavior of skin: Secondary | ICD-10-CM | POA: Diagnosis not present

## 2021-08-10 DIAGNOSIS — D2261 Melanocytic nevi of right upper limb, including shoulder: Secondary | ICD-10-CM | POA: Diagnosis not present

## 2021-08-10 DIAGNOSIS — C44319 Basal cell carcinoma of skin of other parts of face: Secondary | ICD-10-CM | POA: Diagnosis not present

## 2021-08-10 DIAGNOSIS — D2272 Melanocytic nevi of left lower limb, including hip: Secondary | ICD-10-CM | POA: Diagnosis not present

## 2021-08-10 DIAGNOSIS — Z85828 Personal history of other malignant neoplasm of skin: Secondary | ICD-10-CM | POA: Diagnosis not present

## 2021-08-10 DIAGNOSIS — D225 Melanocytic nevi of trunk: Secondary | ICD-10-CM | POA: Diagnosis not present

## 2021-08-10 DIAGNOSIS — L821 Other seborrheic keratosis: Secondary | ICD-10-CM | POA: Diagnosis not present

## 2021-09-14 DIAGNOSIS — U071 COVID-19: Secondary | ICD-10-CM | POA: Diagnosis not present

## 2021-10-27 DIAGNOSIS — C44319 Basal cell carcinoma of skin of other parts of face: Secondary | ICD-10-CM | POA: Diagnosis not present

## 2021-12-05 DIAGNOSIS — I10 Essential (primary) hypertension: Secondary | ICD-10-CM | POA: Diagnosis not present

## 2022-01-31 DIAGNOSIS — E782 Mixed hyperlipidemia: Secondary | ICD-10-CM | POA: Diagnosis not present

## 2022-01-31 DIAGNOSIS — Z23 Encounter for immunization: Secondary | ICD-10-CM | POA: Diagnosis not present

## 2022-01-31 DIAGNOSIS — M25562 Pain in left knee: Secondary | ICD-10-CM | POA: Diagnosis not present

## 2022-01-31 DIAGNOSIS — M25561 Pain in right knee: Secondary | ICD-10-CM | POA: Diagnosis not present

## 2022-01-31 DIAGNOSIS — I10 Essential (primary) hypertension: Secondary | ICD-10-CM | POA: Diagnosis not present

## 2022-02-02 ENCOUNTER — Ambulatory Visit
Admission: RE | Admit: 2022-02-02 | Discharge: 2022-02-02 | Disposition: A | Discharge status: Home / Self Care | Payer: Medicare Other | Source: Ambulatory Visit | Attending: Family Medicine | Admitting: Family Medicine

## 2022-02-02 DIAGNOSIS — E2839 Other primary ovarian failure: Secondary | ICD-10-CM | POA: Insufficient documentation

## 2022-02-02 DIAGNOSIS — Z780 Asymptomatic menopausal state: Secondary | ICD-10-CM | POA: Diagnosis not present

## 2022-03-01 DIAGNOSIS — N952 Postmenopausal atrophic vaginitis: Secondary | ICD-10-CM | POA: Diagnosis not present

## 2022-04-10 DIAGNOSIS — N952 Postmenopausal atrophic vaginitis: Secondary | ICD-10-CM | POA: Diagnosis not present

## 2022-04-13 DIAGNOSIS — D2272 Melanocytic nevi of left lower limb, including hip: Secondary | ICD-10-CM | POA: Diagnosis not present

## 2022-04-13 DIAGNOSIS — D225 Melanocytic nevi of trunk: Secondary | ICD-10-CM | POA: Diagnosis not present

## 2022-04-13 DIAGNOSIS — D2262 Melanocytic nevi of left upper limb, including shoulder: Secondary | ICD-10-CM | POA: Diagnosis not present

## 2022-04-13 DIAGNOSIS — D2261 Melanocytic nevi of right upper limb, including shoulder: Secondary | ICD-10-CM | POA: Diagnosis not present

## 2022-04-13 DIAGNOSIS — L821 Other seborrheic keratosis: Secondary | ICD-10-CM | POA: Diagnosis not present

## 2022-04-13 DIAGNOSIS — L718 Other rosacea: Secondary | ICD-10-CM | POA: Diagnosis not present

## 2022-04-13 DIAGNOSIS — L570 Actinic keratosis: Secondary | ICD-10-CM | POA: Diagnosis not present

## 2022-05-18 DIAGNOSIS — B9689 Other specified bacterial agents as the cause of diseases classified elsewhere: Secondary | ICD-10-CM | POA: Diagnosis not present

## 2022-05-18 DIAGNOSIS — J208 Acute bronchitis due to other specified organisms: Secondary | ICD-10-CM | POA: Diagnosis not present

## 2022-05-18 DIAGNOSIS — Z6828 Body mass index (BMI) 28.0-28.9, adult: Secondary | ICD-10-CM | POA: Diagnosis not present

## 2022-06-12 ENCOUNTER — Other Ambulatory Visit: Payer: Self-pay | Admitting: Family Medicine

## 2022-06-12 DIAGNOSIS — Z1231 Encounter for screening mammogram for malignant neoplasm of breast: Secondary | ICD-10-CM

## 2022-06-19 DIAGNOSIS — Z23 Encounter for immunization: Secondary | ICD-10-CM | POA: Diagnosis not present

## 2022-07-13 ENCOUNTER — Ambulatory Visit
Admission: RE | Admit: 2022-07-13 | Discharge: 2022-07-13 | Disposition: A | Discharge status: Home / Self Care | Payer: Medicare Other | Source: Ambulatory Visit | Attending: Family Medicine | Admitting: Family Medicine

## 2022-07-13 DIAGNOSIS — Z1231 Encounter for screening mammogram for malignant neoplasm of breast: Secondary | ICD-10-CM | POA: Insufficient documentation

## 2022-08-08 DIAGNOSIS — Z0000 Encounter for general adult medical examination without abnormal findings: Secondary | ICD-10-CM | POA: Diagnosis not present

## 2022-08-08 DIAGNOSIS — E782 Mixed hyperlipidemia: Secondary | ICD-10-CM | POA: Diagnosis not present

## 2022-08-08 DIAGNOSIS — Z6828 Body mass index (BMI) 28.0-28.9, adult: Secondary | ICD-10-CM | POA: Diagnosis not present

## 2022-08-08 DIAGNOSIS — Z23 Encounter for immunization: Secondary | ICD-10-CM | POA: Diagnosis not present

## 2022-08-08 DIAGNOSIS — I10 Essential (primary) hypertension: Secondary | ICD-10-CM | POA: Diagnosis not present

## 2022-08-08 DIAGNOSIS — Z1389 Encounter for screening for other disorder: Secondary | ICD-10-CM | POA: Diagnosis not present

## 2022-08-08 DIAGNOSIS — Z79899 Other long term (current) drug therapy: Secondary | ICD-10-CM | POA: Diagnosis not present

## 2022-08-22 DIAGNOSIS — Z6828 Body mass index (BMI) 28.0-28.9, adult: Secondary | ICD-10-CM | POA: Diagnosis not present

## 2022-08-22 DIAGNOSIS — N952 Postmenopausal atrophic vaginitis: Secondary | ICD-10-CM | POA: Diagnosis not present

## 2022-08-22 DIAGNOSIS — N1831 Chronic kidney disease, stage 3a: Secondary | ICD-10-CM | POA: Diagnosis not present

## 2022-08-22 DIAGNOSIS — L719 Rosacea, unspecified: Secondary | ICD-10-CM | POA: Diagnosis not present

## 2022-08-22 DIAGNOSIS — I10 Essential (primary) hypertension: Secondary | ICD-10-CM | POA: Diagnosis not present

## 2022-08-22 DIAGNOSIS — E782 Mixed hyperlipidemia: Secondary | ICD-10-CM | POA: Diagnosis not present

## 2022-11-15 IMAGING — MG DIGITAL SCREENING BREAST BILAT IMPLANT W/ TOMO W/ CAD
9 of 14 series · 9 of 34 positions shown · non-contrast
Comparison: Previous exam(s).

CLINICAL DATA: Screening.

EXAM:
DIGITAL SCREENING BILATERAL MAMMOGRAM WITH IMPLANTS, CAD AND
TOMOSYNTHESIS
TECHNIQUE: Bilateral screening digital craniocaudal and mediolateral oblique
mammograms were obtained. Bilateral screening digital breast
tomosynthesis was performed. The images were evaluated with
computer-aided detection. Standard and/or implant displaced views
were performed.

[R CC]
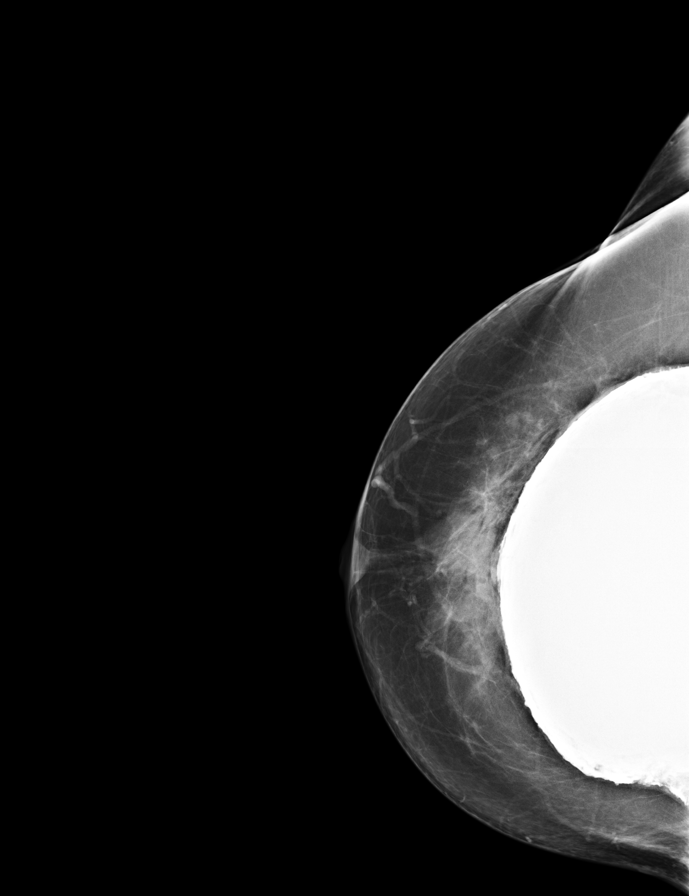

[R MLO]
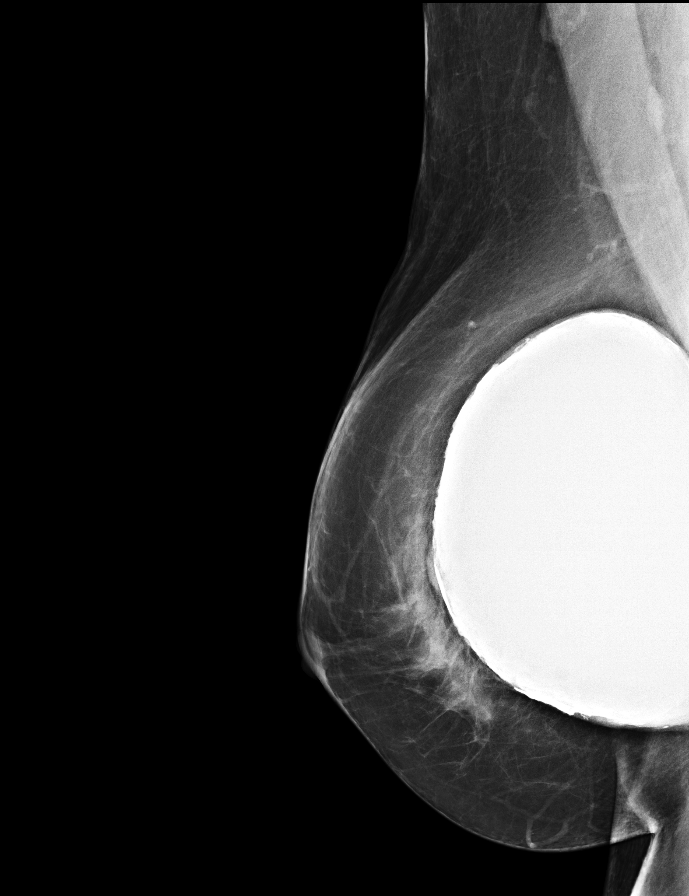

[L CC]
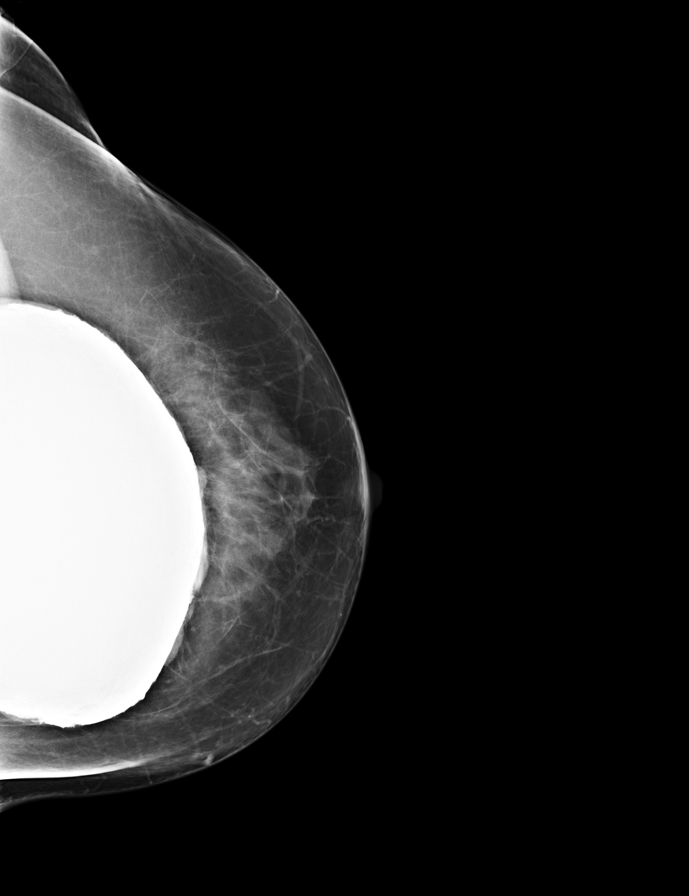

[L MLO]
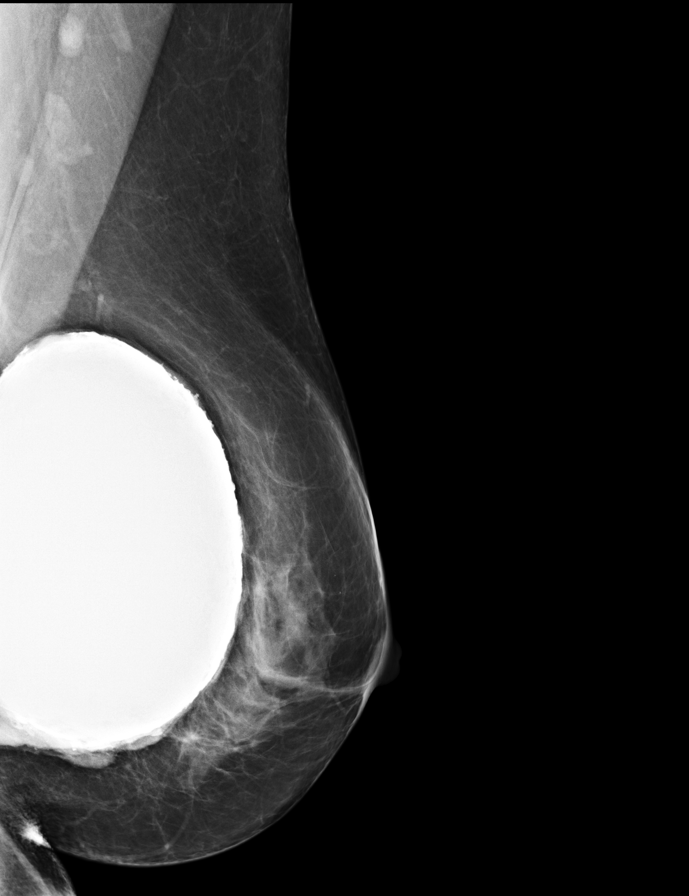

[L MLO synth-2D (1 of 2)]
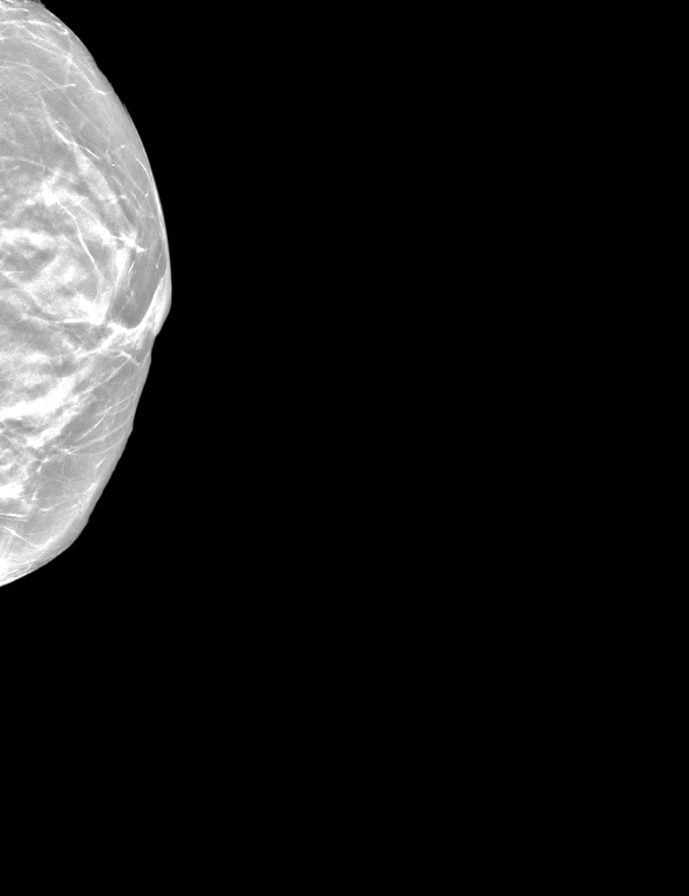

[L MLO synth-2D (2 of 2)]
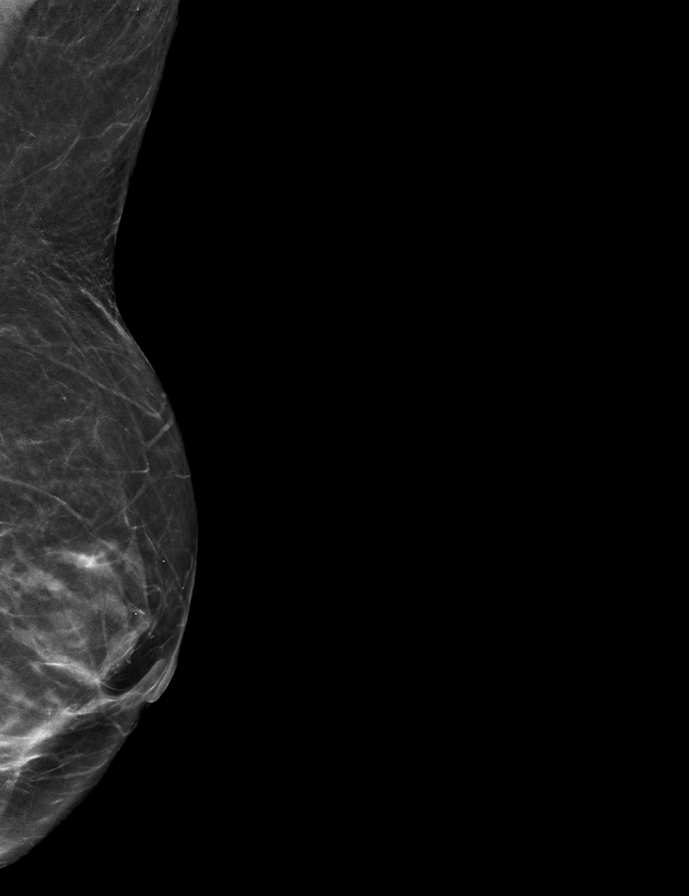

[R MLO synth-2D]
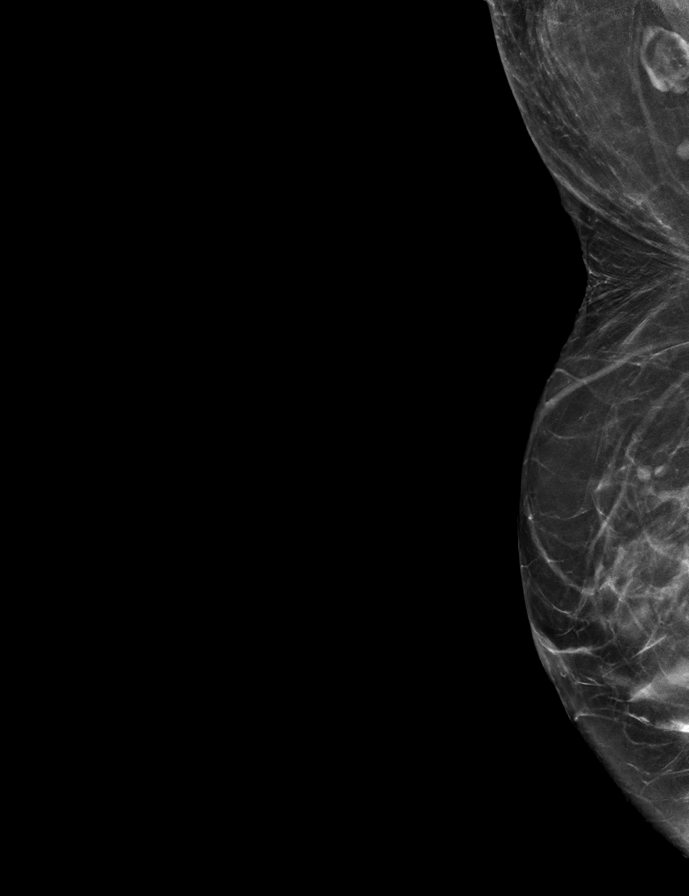

[L CC synth-2D]
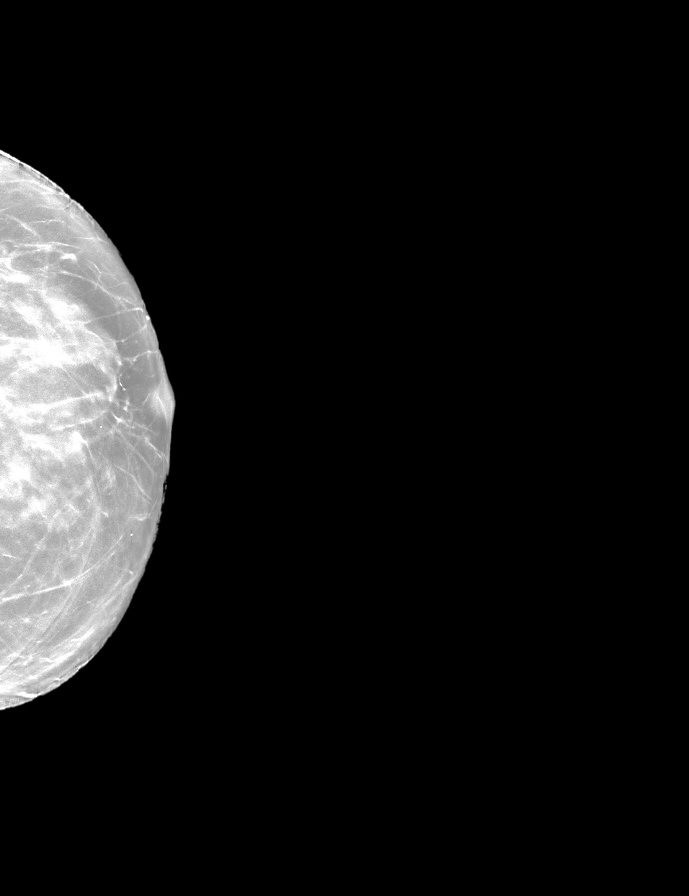

[R CC synth-2D]
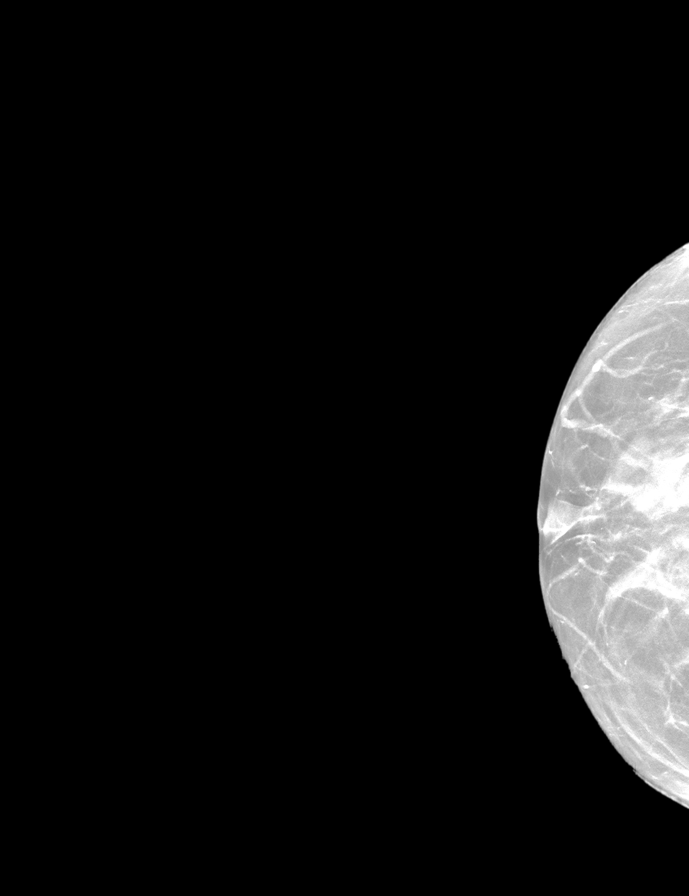

[9 of 34 positions shown; findings below may reference images not displayed]

ACR Breast Density Category c: The breast tissue is heterogeneously
dense, which may obscure small masses.
FINDINGS: The patient has retropectoral implants. There are no findings
suspicious for malignancy.
IMPRESSION: No mammographic evidence of malignancy. A result letter of this
screening mammogram will be mailed directly to the patient.

RECOMMENDATION:
Screening mammogram in one year. (Code:LT-E-7TH)

BI-RADS CATEGORY  1:  Negative.

## 2022-11-20 DIAGNOSIS — Z6828 Body mass index (BMI) 28.0-28.9, adult: Secondary | ICD-10-CM | POA: Diagnosis not present

## 2022-11-20 DIAGNOSIS — R350 Frequency of micturition: Secondary | ICD-10-CM | POA: Diagnosis not present

## 2022-11-20 DIAGNOSIS — N898 Other specified noninflammatory disorders of vagina: Secondary | ICD-10-CM | POA: Diagnosis not present

## 2022-11-20 DIAGNOSIS — N952 Postmenopausal atrophic vaginitis: Secondary | ICD-10-CM | POA: Diagnosis not present

## 2023-04-20 DIAGNOSIS — U071 COVID-19: Secondary | ICD-10-CM | POA: Diagnosis not present

## 2023-04-20 DIAGNOSIS — Z6827 Body mass index (BMI) 27.0-27.9, adult: Secondary | ICD-10-CM | POA: Diagnosis not present

## 2023-05-10 DIAGNOSIS — D225 Melanocytic nevi of trunk: Secondary | ICD-10-CM | POA: Diagnosis not present

## 2023-05-10 DIAGNOSIS — L570 Actinic keratosis: Secondary | ICD-10-CM | POA: Diagnosis not present

## 2023-05-10 DIAGNOSIS — D2272 Melanocytic nevi of left lower limb, including hip: Secondary | ICD-10-CM | POA: Diagnosis not present

## 2023-05-10 DIAGNOSIS — D485 Neoplasm of uncertain behavior of skin: Secondary | ICD-10-CM | POA: Diagnosis not present

## 2023-05-10 DIAGNOSIS — X32XXXA Exposure to sunlight, initial encounter: Secondary | ICD-10-CM | POA: Diagnosis not present

## 2023-05-10 DIAGNOSIS — D2271 Melanocytic nevi of right lower limb, including hip: Secondary | ICD-10-CM | POA: Diagnosis not present

## 2023-05-10 DIAGNOSIS — D2262 Melanocytic nevi of left upper limb, including shoulder: Secondary | ICD-10-CM | POA: Diagnosis not present

## 2023-05-10 DIAGNOSIS — C44712 Basal cell carcinoma of skin of right lower limb, including hip: Secondary | ICD-10-CM | POA: Diagnosis not present

## 2023-05-10 DIAGNOSIS — Z85828 Personal history of other malignant neoplasm of skin: Secondary | ICD-10-CM | POA: Diagnosis not present

## 2023-05-10 DIAGNOSIS — D2261 Melanocytic nevi of right upper limb, including shoulder: Secondary | ICD-10-CM | POA: Diagnosis not present

## 2023-05-10 DIAGNOSIS — D0472 Carcinoma in situ of skin of left lower limb, including hip: Secondary | ICD-10-CM | POA: Diagnosis not present

## 2023-06-12 DIAGNOSIS — Z6827 Body mass index (BMI) 27.0-27.9, adult: Secondary | ICD-10-CM | POA: Diagnosis not present

## 2023-06-12 DIAGNOSIS — J069 Acute upper respiratory infection, unspecified: Secondary | ICD-10-CM | POA: Diagnosis not present

## 2023-06-18 ENCOUNTER — Other Ambulatory Visit: Payer: Self-pay | Admitting: Family Medicine

## 2023-06-18 DIAGNOSIS — Z1231 Encounter for screening mammogram for malignant neoplasm of breast: Secondary | ICD-10-CM

## 2023-06-21 DIAGNOSIS — C44712 Basal cell carcinoma of skin of right lower limb, including hip: Secondary | ICD-10-CM | POA: Diagnosis not present

## 2023-06-21 DIAGNOSIS — D2371 Other benign neoplasm of skin of right lower limb, including hip: Secondary | ICD-10-CM | POA: Diagnosis not present

## 2023-08-09 ENCOUNTER — Ambulatory Visit
Admission: RE | Admit: 2023-08-09 | Discharge: 2023-08-09 | Disposition: A | Discharge status: Home / Self Care | Payer: Medicare Other | Source: Ambulatory Visit | Attending: Family Medicine | Admitting: Family Medicine

## 2023-08-09 ENCOUNTER — Other Ambulatory Visit: Payer: Self-pay | Admitting: Family Medicine

## 2023-08-09 DIAGNOSIS — Z1231 Encounter for screening mammogram for malignant neoplasm of breast: Secondary | ICD-10-CM | POA: Diagnosis not present

## 2023-08-09 DIAGNOSIS — D0472 Carcinoma in situ of skin of left lower limb, including hip: Secondary | ICD-10-CM | POA: Diagnosis not present

## 2023-08-14 DIAGNOSIS — Z6828 Body mass index (BMI) 28.0-28.9, adult: Secondary | ICD-10-CM | POA: Diagnosis not present

## 2023-08-14 DIAGNOSIS — E782 Mixed hyperlipidemia: Secondary | ICD-10-CM | POA: Diagnosis not present

## 2023-08-14 DIAGNOSIS — Z0000 Encounter for general adult medical examination without abnormal findings: Secondary | ICD-10-CM | POA: Diagnosis not present

## 2023-08-14 DIAGNOSIS — Z1331 Encounter for screening for depression: Secondary | ICD-10-CM | POA: Diagnosis not present

## 2023-08-14 DIAGNOSIS — I10 Essential (primary) hypertension: Secondary | ICD-10-CM | POA: Diagnosis not present

## 2023-08-29 DIAGNOSIS — Z01419 Encounter for gynecological examination (general) (routine) without abnormal findings: Secondary | ICD-10-CM | POA: Diagnosis not present

## 2023-08-29 DIAGNOSIS — Z6827 Body mass index (BMI) 27.0-27.9, adult: Secondary | ICD-10-CM | POA: Diagnosis not present

## 2023-08-29 DIAGNOSIS — N1831 Chronic kidney disease, stage 3a: Secondary | ICD-10-CM | POA: Diagnosis not present

## 2023-08-29 DIAGNOSIS — L719 Rosacea, unspecified: Secondary | ICD-10-CM | POA: Diagnosis not present

## 2023-08-29 DIAGNOSIS — I10 Essential (primary) hypertension: Secondary | ICD-10-CM | POA: Diagnosis not present

## 2023-08-29 DIAGNOSIS — Z03818 Encounter for observation for suspected exposure to other biological agents ruled out: Secondary | ICD-10-CM | POA: Diagnosis not present

## 2023-08-29 DIAGNOSIS — E782 Mixed hyperlipidemia: Secondary | ICD-10-CM | POA: Diagnosis not present

## 2023-08-29 DIAGNOSIS — R059 Cough, unspecified: Secondary | ICD-10-CM | POA: Diagnosis not present

## 2023-08-29 DIAGNOSIS — B9789 Other viral agents as the cause of diseases classified elsewhere: Secondary | ICD-10-CM | POA: Diagnosis not present

## 2023-08-29 DIAGNOSIS — J988 Other specified respiratory disorders: Secondary | ICD-10-CM | POA: Diagnosis not present

## 2023-08-29 DIAGNOSIS — N952 Postmenopausal atrophic vaginitis: Secondary | ICD-10-CM | POA: Diagnosis not present

## 2023-08-29 DIAGNOSIS — Z85828 Personal history of other malignant neoplasm of skin: Secondary | ICD-10-CM | POA: Diagnosis not present

## 2023-09-11 DIAGNOSIS — Z23 Encounter for immunization: Secondary | ICD-10-CM | POA: Diagnosis not present

## 2024-02-20 DIAGNOSIS — R051 Acute cough: Secondary | ICD-10-CM | POA: Diagnosis not present

## 2024-02-20 DIAGNOSIS — R0981 Nasal congestion: Secondary | ICD-10-CM | POA: Diagnosis not present

## 2024-02-20 DIAGNOSIS — Z6827 Body mass index (BMI) 27.0-27.9, adult: Secondary | ICD-10-CM | POA: Diagnosis not present

## 2024-03-17 DIAGNOSIS — N1831 Chronic kidney disease, stage 3a: Secondary | ICD-10-CM | POA: Diagnosis not present

## 2024-03-24 DIAGNOSIS — L719 Rosacea, unspecified: Secondary | ICD-10-CM | POA: Diagnosis not present

## 2024-03-24 DIAGNOSIS — E782 Mixed hyperlipidemia: Secondary | ICD-10-CM | POA: Diagnosis not present

## 2024-03-24 DIAGNOSIS — Z6828 Body mass index (BMI) 28.0-28.9, adult: Secondary | ICD-10-CM | POA: Diagnosis not present

## 2024-03-24 DIAGNOSIS — I10 Essential (primary) hypertension: Secondary | ICD-10-CM | POA: Diagnosis not present

## 2024-03-24 DIAGNOSIS — N952 Postmenopausal atrophic vaginitis: Secondary | ICD-10-CM | POA: Diagnosis not present

## 2024-05-22 DIAGNOSIS — R208 Other disturbances of skin sensation: Secondary | ICD-10-CM | POA: Diagnosis not present

## 2024-05-22 DIAGNOSIS — D2262 Melanocytic nevi of left upper limb, including shoulder: Secondary | ICD-10-CM | POA: Diagnosis not present

## 2024-05-22 DIAGNOSIS — D2272 Melanocytic nevi of left lower limb, including hip: Secondary | ICD-10-CM | POA: Diagnosis not present

## 2024-05-22 DIAGNOSIS — Z08 Encounter for follow-up examination after completed treatment for malignant neoplasm: Secondary | ICD-10-CM | POA: Diagnosis not present

## 2024-05-22 DIAGNOSIS — D2261 Melanocytic nevi of right upper limb, including shoulder: Secondary | ICD-10-CM | POA: Diagnosis not present

## 2024-05-22 DIAGNOSIS — L820 Inflamed seborrheic keratosis: Secondary | ICD-10-CM | POA: Diagnosis not present

## 2024-05-22 DIAGNOSIS — D225 Melanocytic nevi of trunk: Secondary | ICD-10-CM | POA: Diagnosis not present

## 2024-05-22 DIAGNOSIS — D2271 Melanocytic nevi of right lower limb, including hip: Secondary | ICD-10-CM | POA: Diagnosis not present

## 2024-05-22 DIAGNOSIS — L718 Other rosacea: Secondary | ICD-10-CM | POA: Diagnosis not present

## 2024-05-22 DIAGNOSIS — Z85828 Personal history of other malignant neoplasm of skin: Secondary | ICD-10-CM | POA: Diagnosis not present

## 2024-05-22 DIAGNOSIS — L821 Other seborrheic keratosis: Secondary | ICD-10-CM | POA: Diagnosis not present

## 2024-07-10 ENCOUNTER — Other Ambulatory Visit: Payer: Self-pay | Admitting: Family Medicine

## 2024-07-10 DIAGNOSIS — Z1231 Encounter for screening mammogram for malignant neoplasm of breast: Secondary | ICD-10-CM

## 2024-08-08 DIAGNOSIS — Z23 Encounter for immunization: Secondary | ICD-10-CM | POA: Diagnosis not present

## 2024-08-13 ENCOUNTER — Ambulatory Visit
Admission: RE | Admit: 2024-08-13 | Discharge: 2024-08-13 | Disposition: A | Discharge status: Home / Self Care | Source: Ambulatory Visit | Attending: Family Medicine | Admitting: Family Medicine

## 2024-08-13 DIAGNOSIS — Z1231 Encounter for screening mammogram for malignant neoplasm of breast: Secondary | ICD-10-CM | POA: Diagnosis not present

## 2024-08-14 DIAGNOSIS — E782 Mixed hyperlipidemia: Secondary | ICD-10-CM | POA: Diagnosis not present

## 2024-08-14 DIAGNOSIS — N1831 Chronic kidney disease, stage 3a: Secondary | ICD-10-CM | POA: Diagnosis not present

## 2024-08-14 DIAGNOSIS — I10 Essential (primary) hypertension: Secondary | ICD-10-CM | POA: Diagnosis not present

## 2024-08-14 DIAGNOSIS — Z0000 Encounter for general adult medical examination without abnormal findings: Secondary | ICD-10-CM | POA: Diagnosis not present

## 2024-08-25 DIAGNOSIS — E875 Hyperkalemia: Secondary | ICD-10-CM | POA: Diagnosis not present

## 2024-08-25 DIAGNOSIS — E782 Mixed hyperlipidemia: Secondary | ICD-10-CM | POA: Diagnosis not present

## 2024-08-25 DIAGNOSIS — N952 Postmenopausal atrophic vaginitis: Secondary | ICD-10-CM | POA: Diagnosis not present

## 2024-08-25 DIAGNOSIS — Z6828 Body mass index (BMI) 28.0-28.9, adult: Secondary | ICD-10-CM | POA: Diagnosis not present

## 2024-08-25 DIAGNOSIS — L719 Rosacea, unspecified: Secondary | ICD-10-CM | POA: Diagnosis not present

## 2024-08-25 DIAGNOSIS — N898 Other specified noninflammatory disorders of vagina: Secondary | ICD-10-CM | POA: Diagnosis not present

## 2024-08-25 DIAGNOSIS — L293 Anogenital pruritus, unspecified: Secondary | ICD-10-CM | POA: Diagnosis not present

## 2024-08-25 DIAGNOSIS — R0981 Nasal congestion: Secondary | ICD-10-CM | POA: Diagnosis not present

## 2024-08-25 DIAGNOSIS — N1831 Chronic kidney disease, stage 3a: Secondary | ICD-10-CM | POA: Diagnosis not present

## 2024-08-25 DIAGNOSIS — Z23 Encounter for immunization: Secondary | ICD-10-CM | POA: Diagnosis not present

## 2024-08-25 DIAGNOSIS — I10 Essential (primary) hypertension: Secondary | ICD-10-CM | POA: Diagnosis not present
